# Patient Record
Sex: Male | Born: 1970 | Race: White | Hispanic: No | Marital: Single | Smoking: Current every day smoker
Health system: Southern US, Community
[De-identification: ages and names within clinical notes are randomized; demographics above are authoritative.]

## PROBLEM LIST (undated history)

## (undated) DIAGNOSIS — I10 Essential (primary) hypertension: Secondary | ICD-10-CM

---

## 2013-11-16 ENCOUNTER — Emergency Department: Payer: Self-pay | Admitting: Emergency Medicine

## 2014-03-25 ENCOUNTER — Ambulatory Visit: Payer: Self-pay

## 2014-07-22 ENCOUNTER — Ambulatory Visit: Payer: Self-pay

## 2015-10-08 ENCOUNTER — Emergency Department: Payer: Self-pay

## 2015-10-08 ENCOUNTER — Encounter
Admission: EM | Disposition: A | Discharge status: Home / Self Care | Payer: Self-pay | Source: Home / Self Care | Attending: Emergency Medicine

## 2015-10-08 ENCOUNTER — Emergency Department
Admission: EM | Admit: 2015-10-08 | Discharge: 2015-10-08 | Disposition: A | Discharge status: Home / Self Care | Payer: Self-pay | Attending: Emergency Medicine | Admitting: Emergency Medicine

## 2015-10-08 ENCOUNTER — Encounter: Payer: Self-pay | Admitting: Certified Registered Nurse Anesthetist

## 2015-10-08 ENCOUNTER — Encounter: Payer: Self-pay | Admitting: Emergency Medicine

## 2015-10-08 DIAGNOSIS — I249 Acute ischemic heart disease, unspecified: Secondary | ICD-10-CM

## 2015-10-08 DIAGNOSIS — I10 Essential (primary) hypertension: Secondary | ICD-10-CM | POA: Insufficient documentation

## 2015-10-08 DIAGNOSIS — R61 Generalized hyperhidrosis: Secondary | ICD-10-CM | POA: Insufficient documentation

## 2015-10-08 DIAGNOSIS — Z720 Tobacco use: Secondary | ICD-10-CM | POA: Insufficient documentation

## 2015-10-08 DIAGNOSIS — Z880 Allergy status to penicillin: Secondary | ICD-10-CM | POA: Insufficient documentation

## 2015-10-08 DIAGNOSIS — I200 Unstable angina: Secondary | ICD-10-CM | POA: Insufficient documentation

## 2015-10-08 HISTORY — DX: Essential (primary) hypertension: I10

## 2015-10-08 HISTORY — PX: CARDIAC CATHETERIZATION: SHX172

## 2015-10-08 LAB — BASIC METABOLIC PANEL
ANION GAP: 5 (ref 5–15)
BUN: 13 mg/dL (ref 6–20)
CO2: 30 mmol/L (ref 22–32)
Calcium: 9.4 mg/dL (ref 8.9–10.3)
Chloride: 103 mmol/L (ref 101–111)
Creatinine, Ser: 0.68 mg/dL (ref 0.61–1.24)
GFR calc Af Amer: >60 mL/min (ref >60)
GFR calc non Af Amer: >60 mL/min (ref >60)
Glucose, Bld: 124 mg/dL — ABNORMAL HIGH (ref 65–99)
POTASSIUM: 3.9 mmol/L (ref 3.5–5.1)
Sodium: 138 mmol/L (ref 135–145)

## 2015-10-08 LAB — CBC
HCT: 46.2 % (ref 40.0–52.0)
Hemoglobin: 15.2 g/dL (ref 13.0–18.0)
MCH: 31.9 pg (ref 26.0–34.0)
MCHC: 32.9 g/dL (ref 32.0–36.0)
MCV: 96.9 fL (ref 80.0–100.0)
Platelets: 238 10*3/uL (ref 150–440)
RBC: 4.77 MIL/uL (ref 4.40–5.90)
RDW: 12.8 % (ref 11.5–14.5)
WBC: 10.1 10*3/uL (ref 3.8–10.6)

## 2015-10-08 LAB — TROPONIN I

## 2015-10-08 LAB — APTT: aPTT: 30 seconds (ref 24–36)

## 2015-10-08 LAB — PROTIME-INR
INR: 0.99
PROTHROMBIN TIME: 13.3 s (ref 11.4–15.0)

## 2015-10-08 SURGERY — LEFT HEART CATH AND CORONARY ANGIOGRAPHY
Anesthesia: Moderate Sedation

## 2015-10-08 MED ORDER — SODIUM CHLORIDE 0.9 % WEIGHT BASED INFUSION: 3.0000 mL/kg/h | INTRAVENOUS | Status: DC

## 2015-10-08 MED ORDER — HEPARIN SODIUM (PORCINE) 5000 UNIT/ML IJ SOLN
60.0000 [IU]/kg | Freq: Once | INTRAMUSCULAR | Status: AC
  Administered 2015-10-08: 3800 [IU] via INTRAVENOUS
  Filled 2015-10-08: qty 1

## 2015-10-08 MED ORDER — ASPIRIN 81 MG PO CHEW: 81.0000 mg | CHEWABLE_TABLET | Freq: Every day | ORAL | Status: DC

## 2015-10-08 MED ORDER — SODIUM CHLORIDE 0.9 % IJ SOLN: 3.0000 mL | INTRAMUSCULAR | Status: DC | PRN

## 2015-10-08 MED ORDER — BIVALIRUDIN BOLUS VIA INFUSION - CUPID
INTRAVENOUS | Status: DC | PRN
  Administered 2015-10-08: 47.625 mg via INTRAVENOUS

## 2015-10-08 MED ORDER — SODIUM CHLORIDE 0.9 % WEIGHT BASED INFUSION: 1.0000 mL/kg/h | INTRAVENOUS | Status: DC

## 2015-10-08 MED ORDER — MIDAZOLAM HCL 2 MG/2ML IJ SOLN
INTRAMUSCULAR | Status: DC | PRN
  Administered 2015-10-08 (×2): 1 mg via INTRAVENOUS

## 2015-10-08 MED ORDER — TICAGRELOR 90 MG PO TABS
ORAL_TABLET | ORAL | Status: DC | PRN
  Administered 2015-10-08: 180 mg via ORAL

## 2015-10-08 MED ORDER — SODIUM CHLORIDE 0.9 % IJ SOLN: 3.0000 mL | Freq: Two times a day (BID) | INTRAMUSCULAR | Status: DC

## 2015-10-08 MED ORDER — FENTANYL CITRATE (PF) 100 MCG/2ML IJ SOLN
INTRAMUSCULAR | Status: AC
Start: 2015-10-08 — End: 2015-10-08
  Filled 2015-10-08: qty 2

## 2015-10-08 MED ORDER — FENTANYL CITRATE (PF) 100 MCG/2ML IJ SOLN
INTRAMUSCULAR | Status: AC
  Filled 2015-10-08: qty 2

## 2015-10-08 MED ORDER — ASPIRIN 81 MG PO CHEW: 324.0000 mg | CHEWABLE_TABLET | ORAL | Status: DC

## 2015-10-08 MED ORDER — ASPIRIN 81 MG PO CHEW
CHEWABLE_TABLET | ORAL | Status: AC
  Filled 2015-10-08: qty 4

## 2015-10-08 MED ORDER — BIVALIRUDIN 250 MG IV SOLR
INTRAVENOUS | Status: AC
  Filled 2015-10-08: qty 250

## 2015-10-08 MED ORDER — CLOPIDOGREL BISULFATE 75 MG PO TABS: 600.0000 mg | ORAL_TABLET | Freq: Once | ORAL | Status: DC

## 2015-10-08 MED ORDER — TICAGRELOR 90 MG PO TABS
ORAL_TABLET | ORAL | Status: AC
Start: 2015-10-08 — End: 2015-10-08
  Filled 2015-10-08: qty 2

## 2015-10-08 MED ORDER — MIDAZOLAM HCL 2 MG/2ML IJ SOLN
INTRAMUSCULAR | Status: AC
  Filled 2015-10-08: qty 2

## 2015-10-08 MED ORDER — ASPIRIN 81 MG PO CHEW
CHEWABLE_TABLET | ORAL | Status: DC | PRN
  Administered 2015-10-08: 324 mg via ORAL

## 2015-10-08 MED ORDER — HEPARIN (PORCINE) IN NACL 100-0.45 UNIT/ML-% IJ SOLN: 10.0000 [IU]/kg/h | Freq: Once | INTRAMUSCULAR | Status: DC

## 2015-10-08 MED ORDER — SODIUM CHLORIDE 0.9 % IV SOLN
250.0000 mL | INTRAVENOUS | Status: DC | PRN
Start: 2015-10-08 — End: 2015-10-08

## 2015-10-08 MED ORDER — NITROGLYCERIN 2 % TD OINT
1.0000 [in_us] | TOPICAL_OINTMENT | Freq: Once | TRANSDERMAL | Status: AC
  Administered 2015-10-08: 1 [in_us] via TOPICAL
  Filled 2015-10-08: qty 1

## 2015-10-08 MED ORDER — ONDANSETRON HCL 4 MG/2ML IJ SOLN: 4.0000 mg | Freq: Four times a day (QID) | INTRAMUSCULAR | Status: DC | PRN

## 2015-10-08 MED ORDER — NITROGLYCERIN 5 MG/ML IV SOLN
INTRAVENOUS | Status: AC
  Filled 2015-10-08: qty 10

## 2015-10-08 MED ORDER — FENTANYL CITRATE (PF) 100 MCG/2ML IJ SOLN
INTRAMUSCULAR | Status: DC | PRN
  Administered 2015-10-08 (×2): 50 ug via INTRAVENOUS

## 2015-10-08 MED ORDER — SODIUM CHLORIDE 0.9 % IV SOLN: 250.0000 mL | INTRAVENOUS | Status: DC | PRN

## 2015-10-08 MED ORDER — HEPARIN (PORCINE) IN NACL 100-0.45 UNIT/ML-% IJ SOLN
750.0000 [IU]/h | INTRAMUSCULAR | Status: DC
  Filled 2015-10-08: qty 250

## 2015-10-08 MED ORDER — HEPARIN (PORCINE) IN NACL 2-0.9 UNIT/ML-% IJ SOLN
INTRAMUSCULAR | Status: AC
  Filled 2015-10-08: qty 1000

## 2015-10-08 MED ORDER — IOHEXOL 300 MG/ML  SOLN
INTRAMUSCULAR | Status: DC | PRN
  Administered 2015-10-08: 180 mL via INTRA_ARTERIAL

## 2015-10-08 MED ORDER — ACETAMINOPHEN 325 MG PO TABS: 650.0000 mg | ORAL_TABLET | ORAL | Status: DC | PRN

## 2015-10-08 SURGICAL SUPPLY — 15 items
BALLN TREK RX 3.0X15 (BALLOONS)
BALLOON TREK RX 3.0X15 (BALLOONS) IMPLANT
CATH INFINITI 5FR ANG PIGTAIL (CATHETERS) ×3 IMPLANT
CATH INFINITI 5FR JL4 (CATHETERS) ×3 IMPLANT
CATH INFINITI JR4 5F (CATHETERS) ×3 IMPLANT
CATH VISTA GUIDE 6FR XB3.5 SH (CATHETERS) ×3 IMPLANT
DEVICE CLOSURE MYNXGRIP 6/7F (Vascular Products) ×3 IMPLANT
DEVICE INFLAT 30 PLUS (MISCELLANEOUS) IMPLANT
KIT MANI 3VAL PERCEP (MISCELLANEOUS) ×3 IMPLANT
NEEDLE PERC 18GX7CM (NEEDLE) ×3 IMPLANT
PACK CARDIAC CATH (CUSTOM PROCEDURE TRAY) ×3 IMPLANT
SHEATH AVANTI 5FR X 11CM (SHEATH) ×3 IMPLANT
SHEATH AVANTI 6FR X 11CM (SHEATH) ×3 IMPLANT
WIRE EMERALD 3MM-J .035X150CM (WIRE) ×3 IMPLANT
WIRE G HI TQ BMW 190 (WIRE) IMPLANT

## 2015-10-08 NOTE — Discharge Instructions (Signed)
Check right groin for bleeding or hematoma.  Patient will be on bedrest for 2 hours post sheath pull---out of bed at______________.  Bilateral pulses are ___________.Groin Insertion Instructions-If you lose feeling or develop tingling or pain in your leg or foot after the procedure, please walk around first.  If the discomfort does not improve , contact your physician and proceed to the nearest emergency room.  Loss of feeling in your leg might mean that a blockage has formed in the artery and this can be appropriately treated.  Limit your activity for the next two days after your procedure.  Avoid stooping, bending, heavy lifting or exertion as this may put pressure on the insertion site.  Resume normal activities in 48 hours.  You may shower after 24 hours but avoid excessive warm water and do not scrub the site.  Remove clear dressing in 48 hours.  If you have had a closure device inserted, do not soak in a tub bath or a hot tub for at least one week. ° °No driving for 48 hours after discharge.  After the procedure, check the insertion site occasionally.  If any oozing occurs or there is apparent swelling, firm pressure over the site will prevent a bruise from forming.  You can not hurt anything by pressing directly on the site.  The pressure stops the bleeding by allowing a small clot to form.  If the bleeding continues after the pressure has been applied for more than 15 minutes, call 911 or go to the nearest emergency room.   ° °The x-ray dye causes you to pass a considerate amount of urine.  For this reason, you will be asked to drink plenty of liquids after the procedure to prevent dehydration.  You may resume you regular diet.  Avoid caffeine products.   ° °For pain at the site of your procedure, take non-aspirin medicines such as Tylenol. ° °Medications: A. Hold Metformin for 48 hours if applicable.  B. Continue taking all your present medications at home unless your doctor prescribes any changes. °

## 2015-10-08 NOTE — ED Notes (Signed)
Cath lab called for report. Informed that heparin drip is still pending from pharmacy.

## 2015-10-08 NOTE — ED Notes (Signed)
[facility]. They noticed some ekg changes

## 2015-10-08 NOTE — ED Notes (Signed)
Transported to Cath lab.

## 2015-10-08 NOTE — ED Provider Notes (Signed)
Time Seen: Approximately ----------------------------------------- 1:14 PM on 10/08/2015 -----------------------------------------   I have reviewed the triage notes  Chief Complaint: Chest Pain   History of Present Illness: Charles Hodge is a 44 y.o. male who presents with a history consistent with stable angina. Patient states he's had a [facility] the past for what sounds like stable angina. Patient states he was at the cardiologist's office earlier today and is now having chest pain just at rest. His EKG overall appears normal on one EKG and then when he was having chest discomfort showed diffuse ST elevation and findings of a acute ischemic event especially over the inferior leads. The patient arrives to the emergency department was referred here to evaluate, stabilize, initiate laboratory work, and watch the patient to he can receive heart catheterization. He is currently asymptomatic. Patient received baby aspirin prior to arrival  Past Medical History  Diagnosis Date  . Hypertension     There are no active problems to display for this patient.   History reviewed. No pertinent past surgical history.  History reviewed. No pertinent past surgical history.  No current outpatient prescriptions on file.  Allergies:  Penicillins  Family History: No family history on file.  Social History: Social History  Substance Use Topics  . Smoking status: Current Every Day Smoker  . Smokeless tobacco: None  . Alcohol Use: Yes     Review of Systems:   10 point review of systems was performed and was otherwise negative:  Constitutional: No fever Eyes: No visual disturbances ENT: No sore throat, ear pain Cardiac: No chest pain currently. He states he does get diaphoretic when the pain gets intense. Respiratory: No shortness of breath, wheezing, or stridor Abdomen: No abdominal pain, no vomiting, No diarrhea Endocrine: No weight loss, No night  sweats Extremities: No peripheral edema, cyanosis Skin: No rashes, easy bruising Neurologic: No focal weakness, trouble with speech or swollowing Urologic: No dysuria, Hematuria, or urinary frequency  Physical Exam:  ED Triage Vitals  Enc Vitals Group     BP 10/08/15 1235 155/102 mmHg     Pulse Rate 10/08/15 1235 72     Resp 10/08/15 1235 20     Temp 10/08/15 1235 98 F (36.7 C)     Temp src --      SpO2 10/08/15 1235 100 %     Weight 10/08/15 1235 140 lb (63.504 kg)     Height 10/08/15 1235 5\' 10"  (1.778 m)     Head Cir --      Peak Flow --      Pain Score --      Pain Loc --      Pain Edu? --      Excl. in GC? --     General: Awake , Alert , and Oriented times 3; GCS 15 Head: Normal cephalic , atraumatic Eyes: Pupils equal , round, reactive to light Nose/Throat: No nasal drainage, patent upper airway without erythema or exudate.  Neck: Supple, Full range of motion, No anterior adenopathy or palpable thyroid masses Lungs: Clear to ascultation without wheezes , rhonchi, or rales Heart: Regular rate, regular rhythm without murmurs , gallops , or rubs Abdomen: Soft, non tender without rebound, guarding , or rigidity; bowel sounds positive and symmetric in all 4 quadrants. No organomegaly .        Extremities: 2 plus symmetric pulses. No edema, clubbing or cyanosis Neurologic: normal ambulation, Motor symmetric without deficits, sensory intact Skin: warm,  dry, no rashes   Labs:   All laboratory work was reviewed including any pertinent negatives or positives listed below:  Labs Reviewed  APTT  CBC  BASIC METABOLIC PANEL  PROTIME-INR  TROPONIN I   laboratory work is pending at the time of dictation  EKG: ED ECG REPORT I, Jennye Moccasin, the attending physician, personally viewed and interpreted this ECG.  Date: 10/08/2015 EKG Time: 1252 Rate: 72 Rhythm: normal sinus rhythm QRS Axis: Right axis deviation Intervals: normal ST/T Wave abnormalities:  normal Conduction Disutrbances: none Narrative Interpretation: unremarkable Borderline left ventricular hypertrophy No acute ischemic changes are noted   Radiology:   Pending    ED Course: Patient was placed on a continuous cardiac monitor and pulse oximetry and remained stable while here in emergency department. The patient will be observed until he can receive his heart catheterization. Patient's currently again asymptomatic though obvious concerns for unstable angina and acute coronary syndrome. Patient be started on heparin and has nitroglycerin paste applied.    Assessment: Unstable angina    Plan:  Heart catheterization per [facility]           Jennye Moccasin, MD 10/08/15 1320

## 2015-10-08 NOTE — ED Notes (Signed)
Dr. Juliann Paresallwood agrees to have patient sent to recovery without heparin drip.

## 2015-10-09 ENCOUNTER — Encounter: Payer: Self-pay | Admitting: Internal Medicine

## 2020-02-21 ENCOUNTER — Encounter: Payer: Self-pay | Admitting: Emergency Medicine

## 2020-02-21 ENCOUNTER — Other Ambulatory Visit: Payer: Self-pay

## 2020-02-21 ENCOUNTER — Ambulatory Visit (INDEPENDENT_AMBULATORY_CARE_PROVIDER_SITE_OTHER)
Admit: 2020-02-21 | Discharge: 2020-02-21 | Disposition: A | Discharge status: Home / Self Care | Payer: Self-pay | Attending: Internal Medicine | Admitting: Internal Medicine

## 2020-02-21 ENCOUNTER — Ambulatory Visit
Admission: EM | Admit: 2020-02-21 | Discharge: 2020-02-21 | Disposition: A | Discharge status: Home / Self Care | Payer: Self-pay | Attending: Internal Medicine | Admitting: Internal Medicine

## 2020-02-21 DIAGNOSIS — R519 Headache, unspecified: Secondary | ICD-10-CM

## 2020-02-21 DIAGNOSIS — I10 Essential (primary) hypertension: Secondary | ICD-10-CM

## 2020-02-21 MED ORDER — METHOCARBAMOL 500 MG PO TABS: 1000.0000 mg | ORAL_TABLET | Freq: Three times a day (TID) | ORAL | 0 refills | Status: AC

## 2020-02-21 MED ORDER — KETOROLAC TROMETHAMINE 60 MG/2ML IM SOLN
60.0000 mg | Freq: Once | INTRAMUSCULAR | Status: AC
  Administered 2020-02-21: 60 mg via INTRAMUSCULAR

## 2020-02-21 MED ORDER — BUTALBITAL-APAP-CAFFEINE 50-325-40 MG PO TABS: 1.0000 | ORAL_TABLET | Freq: Four times a day (QID) | ORAL | 0 refills | Status: AC | PRN

## 2020-02-21 NOTE — Discharge Instructions (Addendum)
Your head CT is normal. I suspect that the pop may have been a little soft tissue on the occipital  ( back of your head) muscle area. But you blood pressure being elevated could make things worse. I will have you try a muscle relaxer and Tramadol for pain, and see if this will help. It is OK to add Tylenol 1000 mg to the tramadol but no more than that. More of it will demage your liver.  If you get worse in 24-48 hours, please go to the ER for further work up, that we cant do here like a spinal tap, and multiple blood work. If your pain improves, but your blood pressure is still over 140/90, take a second dose of hydrochlorothiazide ( total of 25 mg) until you see your family Dr, and make sure to eat a banana, potatoes or other potasium rich foods

## 2020-02-21 NOTE — ED Provider Notes (Addendum)
[facility]    CSN: [id number] Arrival date & time: 02/21/20  3474      History   Chief Complaint Chief Complaint  Patient presents with  . Headache    HPI Charles Hodge is a 49 y.o. male. who presents with severe persistent HA on the Top of his head. " Feels like the worse HA ever" He never gets HA's. Had mild nausea yesterday, but has not vomited. He denies vision changes. He tried Tylenol and mucinex and has not helped much. Tylenol 2000 mg brought the pain to 5/10. The HA in his head feels like pressure. HA pain is 7/10 when not coughing, and 10/10 when he coughs and the pain. Pain is provoked with cough and light, and nothing has helped so far. Has not been able to sleep due to the HA.  Two weeks ago, had mild rhinitis and PND and only lasted 2 days and resolved.  Denies change in smell or taste of food.     Past Medical History:  Diagnosis Date  . Hypertension     There are no problems to display for this patient.   Past Surgical History:  Procedure Laterality Date  . CARDIAC CATHETERIZATION N/A 10/08/2015   Procedure: Left Heart Cath and Coronary Angiography;  Surgeon: Alwyn Pea, MD;  Location: [facility] INVASIVE CV LAB;  Service: Cardiovascular;  Laterality: N/A;       Home Medications    Prior to Admission medications   Medication Sig Start Date End Date Taking? Authorizing Provider  amLODipine (NORVASC) 10 MG tablet Take 10 mg by mouth daily.   Yes [provider]  hydrochlorothiazide (MICROZIDE) 12.5 MG capsule Take 12.5 mg by mouth daily.   Yes [provider]  butalbital-acetaminophen-caffeine (FIORICET) 50-325-40 MG tablet Take 1 tablet by mouth every 6 (six) hours as needed for headache. 02/21/20 02/20/21  Rodriguez-Southworth, Nettie Elm, PA-C  methocarbamol (ROBAXIN) 500 MG tablet Take 2 tablets (1,000 mg total) by mouth 3 (three) times daily. To relax muscles 02/21/20   Rodriguez-Southworth, Nettie Elm, PA-C    Family  History Family History  Problem Relation Age of Onset  . Heart failure Mother   . Cancer Father        lymph    Social History Social History   Tobacco Use  . Smoking status: Current Every Day Smoker    Types: Cigarettes  . Smokeless tobacco: Never Used  Substance Use Topics  . Alcohol use: Yes  . Drug use: Not Currently     Allergies   Penicillins   Review of Systems Review of Systems  Constitutional: Negative for chills, diaphoresis and fever.  HENT: Negative for ear discharge, ear pain, facial swelling, postnasal drip, rhinorrhea, sinus pain, sore throat and trouble swallowing.   Eyes: Positive for photophobia. Negative for visual disturbance.  Respiratory: Positive for cough. Negative for shortness of breath.        Has chronic cough from smoking, but is not any worse  Cardiovascular: Negative for chest pain.       His blood pressure is always over 140 systolically.   Gastrointestinal: Negative for nausea and vomiting.  Genitourinary: Negative for difficulty urinating.  Musculoskeletal: Negative for gait problem and myalgias.  Skin: Negative for rash.  Neurological: Positive for headaches. Negative for dizziness, tremors, syncope, speech difficulty, weakness, light-headedness and numbness.  Hematological: Negative for adenopathy.  Psychiatric/Behavioral: Positive for sleep disturbance.       Due to pain     Physical Exam Triage  Vital Signs ED Triage Vitals  Enc Vitals Group     BP 02/21/20 1002 (!) 153/113     Pulse Rate 02/21/20 1002 70     Resp 02/21/20 1002 18     Temp 02/21/20 1002 98.3 F (36.8 C)     Temp Source 02/21/20 1002 Oral     SpO2 02/21/20 1002 99 %     Weight 02/21/20 0958 145 lb (65.8 kg)     Height 02/21/20 0958 5\' 10"  (1.778 m)     Head Circumference --      Peak Flow --      Pain Score 02/21/20 0957 7     Pain Loc --      Pain Edu? --      Excl. in GC? --    No data found.  Updated Vital Signs BP (!) 153/113 (BP Location:  Left Arm)   Pulse 70   Temp 98.3 F (36.8 C) (Oral)   Resp 18   Ht 5\' 10"  (1.778 m)   Wt 145 lb (65.8 kg)   SpO2 99%   BMI 20.81 kg/m   Visual Acuity Right Eye Distance:   Left Eye Distance:   Bilateral Distance:    Right Eye Near:   Left Eye Near:    Bilateral Near:     Physical Exam Physical Exam Vitals signs and nursing note reviewed.  Constitutional:      General: He is not in acute distress.    Appearance: He is well-developed and normal weight. He is not ill-appearing, toxic-appearing or diaphoretic.  HENT: TM's gray and shiny, Pharynx is clear, nose =- normal mucosa, has mild tenderness on maxillary sinuses, but this is not where he feels the pain when he coughs.     Head: Normocephalic.  Eyes:     Extraocular Movements: Extraocular movements intact.     Pupils: Pupils are equal, round, and reactive to light.  Neck:     Musculoskeletal: Neck supple. No neck rigidity.     Meningeal: Brudzinski's sign absent.  Cardiovascular:     Rate and Rhythm: Normal rate and regular rhythm.     Heart sounds: No murmur.  Pulmonary:     Effort: Pulmonary effort is normal.     Breath sounds: Normal breath sounds. No wheezing, rhonchi or rales.  Abdominal:     General: Bowel sounds are normal.     Palpations: Abdomen is soft. There is no mass.     Tenderness: There is no abdominal tenderness. There is no guarding.  Musculoskeletal: Normal range of motion.  Lymphadenopathy:     Cervical: No cervical adenopathy.  Skin:    General: Skin is warm and dry.  Neurological:     Mental Status: He is alert.     Cranial Nerves: No cranial nerve deficit or facial asymmetry.     Sensory: No sensory deficit.     Motor: No weakness.     Coordination: Romberg sign negative. Coordination normal.     Gait: Gait normal.     Deep Tendon Reflexes: Reflexes normal.     Comments: Normal Romberg, propioception, finger to nose. His tandem gait was a little off balance.   Psychiatric:        Mood  and Affect: Mood normal.        Speech: Speech normal.        Behavior: Behavior normal.     UC Treatments / Results  Labs (all labs ordered are listed, but only abnormal results  are displayed) Labs Reviewed - No data to display  EKG   Radiology CT Head Wo Contrast  Result Date: 02/21/2020 CLINICAL DATA:  Acute headache following coughing 3 days ago EXAM: CT HEAD WITHOUT CONTRAST TECHNIQUE: Contiguous axial images were obtained from the base of the skull through the vertex without intravenous contrast. COMPARISON:  None. FINDINGS: Brain: No evidence of acute infarction, hemorrhage, hydrocephalus, extra-axial collection or mass lesion/mass effect. Vascular: Negative for hyperdense vessel Skull: Negative Sinuses/Orbits: Negative Other: None IMPRESSION: Negative CT head Electronically Signed   By: Franchot Gallo M.D.   On: 02/21/2020 11:52    Procedures Procedures (including critical care time)  Medications Ordered in UC Medications  ketorolac (TORADOL) injection 60 mg (60 mg Intramuscular Given 02/21/20 1220)    Initial Impression / Assessment and Plan / UC Course  I have reviewed the triage vital signs and the nursing notes. Pertinent  imaging results that were available during my care of the patient were reviewed by me and considered in my medical decision making (see chart for details). He was given Toradol 60 mg IM while here and helped his pain reduce to 4/10 when he left. I also  placed on Fioricet and Robaxen for home.  Needs to go to ER if he gets worse.  Needs to keep visit with PCP for next week as scheduled.   Final Clinical Impressions(s) / UC Diagnoses   Final diagnoses:  Bad headache  Essential hypertension     Discharge Instructions     Your head CT is normal. I suspect that the pop may have been a little soft tissue on the occipital  ( back of your head) muscle area. But you blood pressure being elevated could make things worse. I will have you try a muscle  relaxer and Tramadol for pain, and see if this will help. It is OK to add Tylenol 1000 mg to the tramadol but no more than that. More of it will demage your liver.  If you get worse in 24-48 hours, please go to the ER for further work up, that we cant do here like a spinal tap, and multiple blood work. If your pain improves, but your blood pressure is still over 140/90, take a second dose of hydrochlorothiazide ( total of 25 mg) until you see your family Dr, and make sure to eat a banana, potatoes or other potasium rich foods     ED Prescriptions    Medication Sig Dispense Auth. Provider   butalbital-acetaminophen-caffeine (FIORICET) 50-325-40 MG tablet Take 1 tablet by mouth every 6 (six) hours as needed for headache. 20 tablet Rodriguez-Southworth, Sunday Spillers, PA-C   methocarbamol (ROBAXIN) 500 MG tablet Take 2 tablets (1,000 mg total) by mouth 3 (three) times daily. To relax muscles 20 tablet Rodriguez-Southworth, Sunday Spillers, PA-C     I have reviewed the PDMP during this encounter.   Shelby Mattocks, PA-C 02/21/20 1228    Rodriguez-Southworth, Maynard, PA-C 02/21/20 1240

## 2020-02-21 NOTE — ED Triage Notes (Signed)
Pt c/o headache. He states that about 3 days he coughed hard and felt like something "popped" in his head and has had a headache ever since. He states that he has been taking 2000 mg of Tylenol at a time. He states that he headache is keeping him from sleeping. Denies fever or any other covid symptoms. Denies head trauma.

## 2021-08-30 IMAGING — CT CT HEAD W/O CM
2 series · 15 of 30 positions shown, 17 images · non-contrast
Comparison: None.

CLINICAL DATA: Acute headache following coughing 3 days ago

EXAM:
CT HEAD WITHOUT CONTRAST
TECHNIQUE: Contiguous axial images were obtained from the base of the skull
through the vertex without intravenous contrast.

[Series 2: head wo · axial · 0.45mm/px · z∈[-62,+43]mm · 7 of 29 slices shown, 9 images]
[im 4/29  brain]
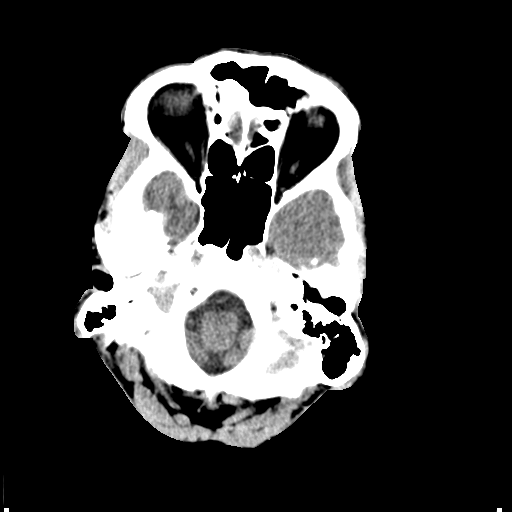
[im 4/29  bone]
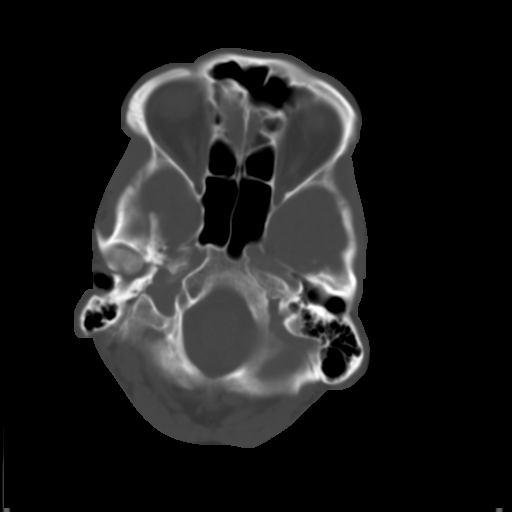
[im 8/29  brain]
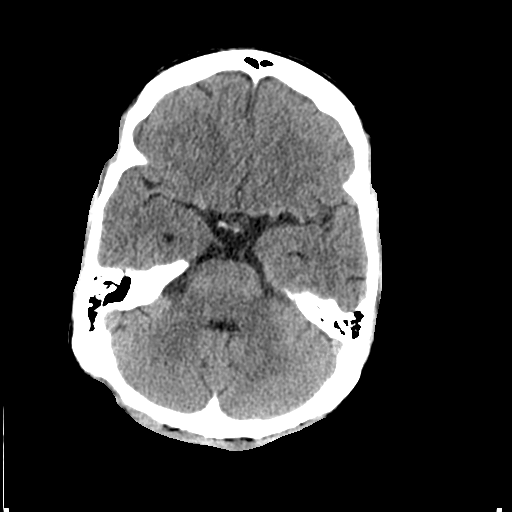
[im 11/29  brain]
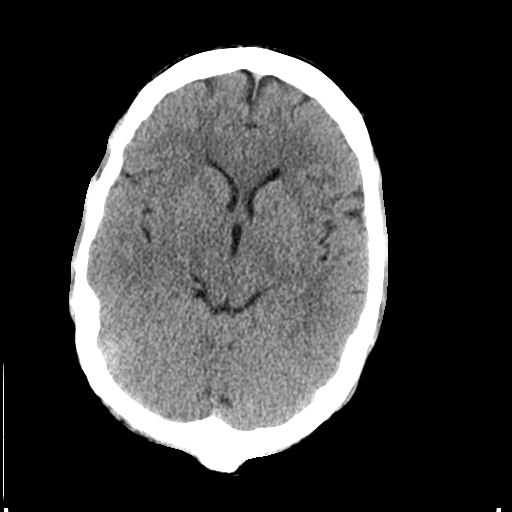
[im 15/29  brain]
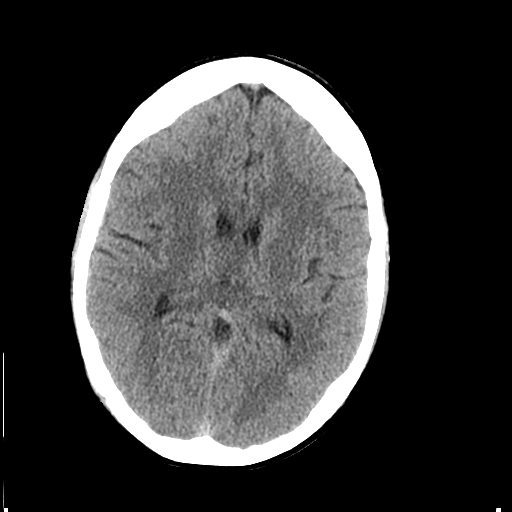
[im 18/29  brain]
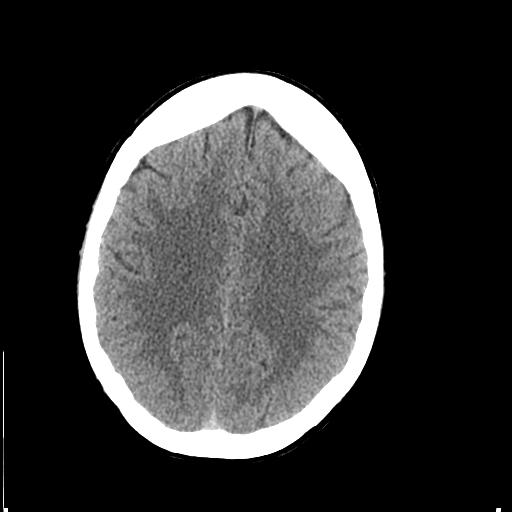
[im 18/29  bone]
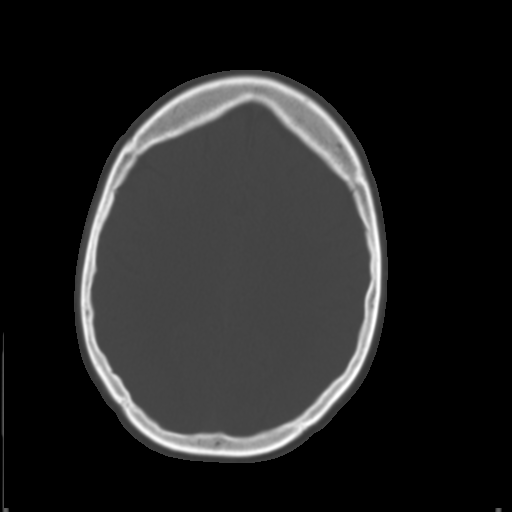
[im 22/29  brain]
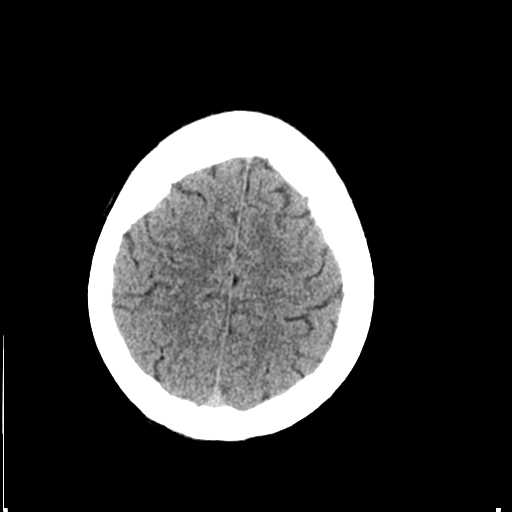
[im 25/29  brain]
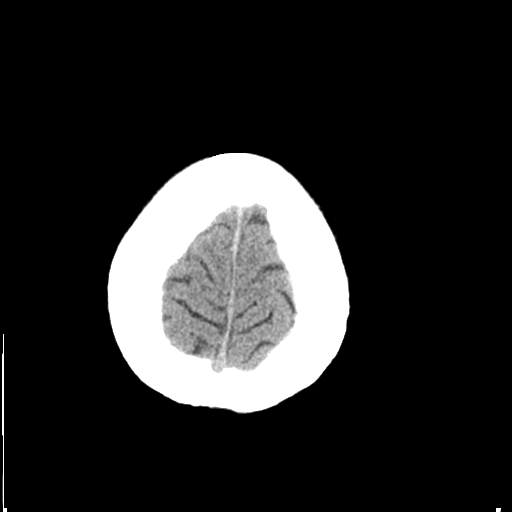

[Series 3: head bone · axial · 0.45mm/px · z∈[-63,+49]mm · 8 of 72 slices shown]
[im 8/72  bone]
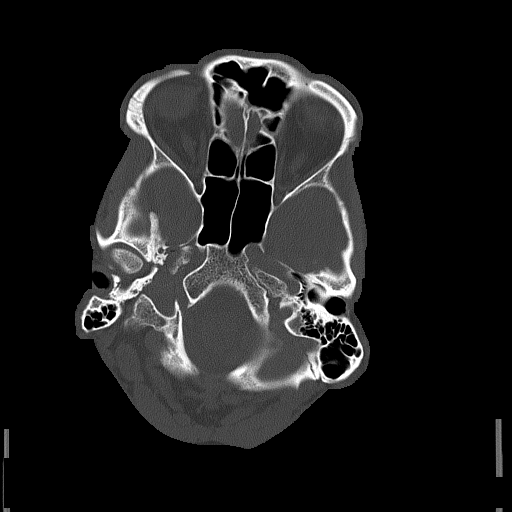
[im 15/72  bone]
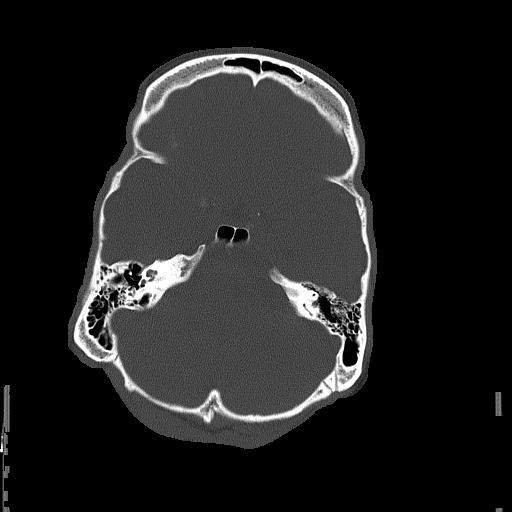
[im 22/72  bone]
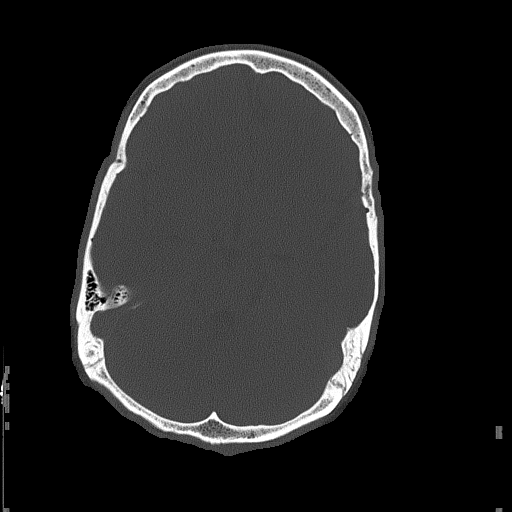
[im 32/72  bone]
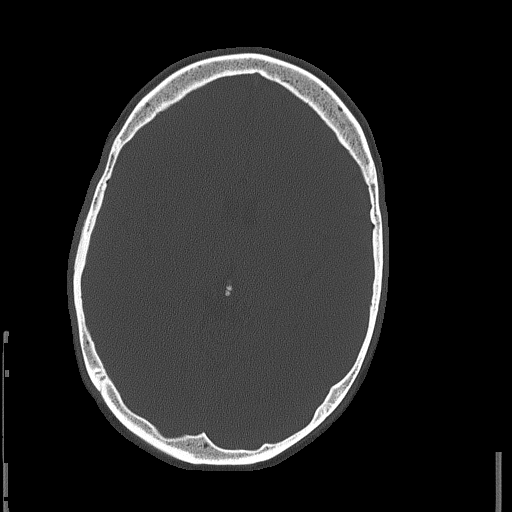
[im 40/72  bone]
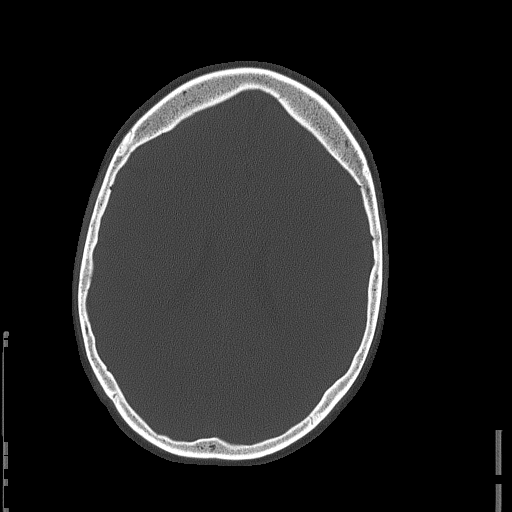
[im 50/72  bone]
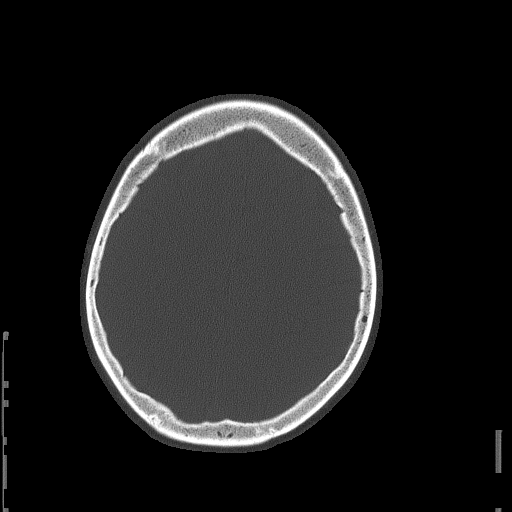
[im 57/72  bone]
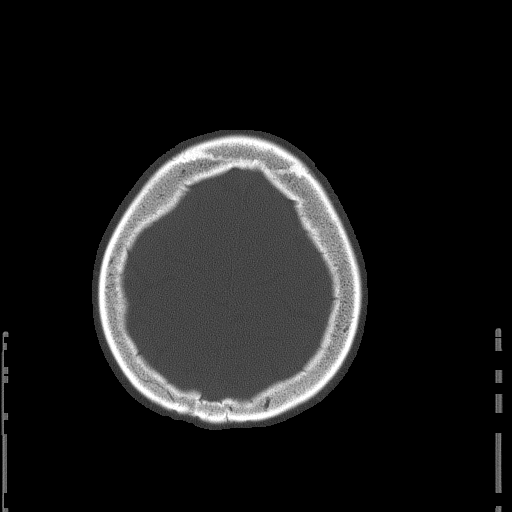
[im 64/72  bone]
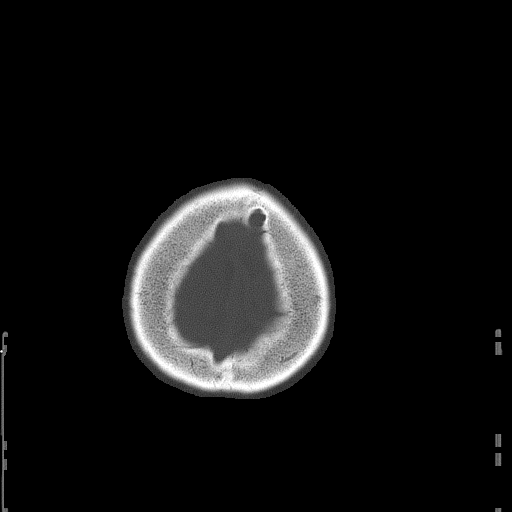

[15 of 30 positions shown; findings below may reference images not displayed]

FINDINGS: Brain: No evidence of acute infarction, hemorrhage, hydrocephalus,
extra-axial collection or mass lesion/mass effect.

Vascular: Negative for hyperdense vessel

Skull: Negative

Sinuses/Orbits: Negative

Other: None
IMPRESSION: Negative CT head
